# Patient Record
Sex: Male | Born: 1960 | Hispanic: Yes | Marital: Married
Health system: Southern US, Community
[De-identification: ages and names within clinical notes are randomized; demographics above are authoritative.]

## PROBLEM LIST (undated history)

## (undated) DIAGNOSIS — E119 Type 2 diabetes mellitus without complications: Secondary | ICD-10-CM

## (undated) HISTORY — PX: OTHER SURGICAL HISTORY: SHX169

---

## 2015-01-01 ENCOUNTER — Other Ambulatory Visit (HOSPITAL_COMMUNITY): Payer: Self-pay

## 2015-01-01 ENCOUNTER — Ambulatory Visit (HOSPITAL_COMMUNITY)
Admission: RE | Admit: 2015-01-01 | Discharge: 2015-01-01 | Disposition: A | Discharge status: Home / Self Care | Payer: Managed Care, Other (non HMO) | Source: Ambulatory Visit | Attending: Family Medicine | Admitting: Family Medicine

## 2015-01-01 DIAGNOSIS — M25562 Pain in left knee: Secondary | ICD-10-CM | POA: Diagnosis present

## 2015-01-01 DIAGNOSIS — M179 Osteoarthritis of knee, unspecified: Secondary | ICD-10-CM | POA: Diagnosis not present

## 2017-01-09 IMAGING — DX DG KNEE COMPLETE 4+V*L*
3 series · 3 of 3 positions shown · non-contrast
Comparison: None.

CLINICAL DATA: Left knee pain.

EXAM:
LEFT KNEE - COMPLETE 4+ VIEW

[knee ap]
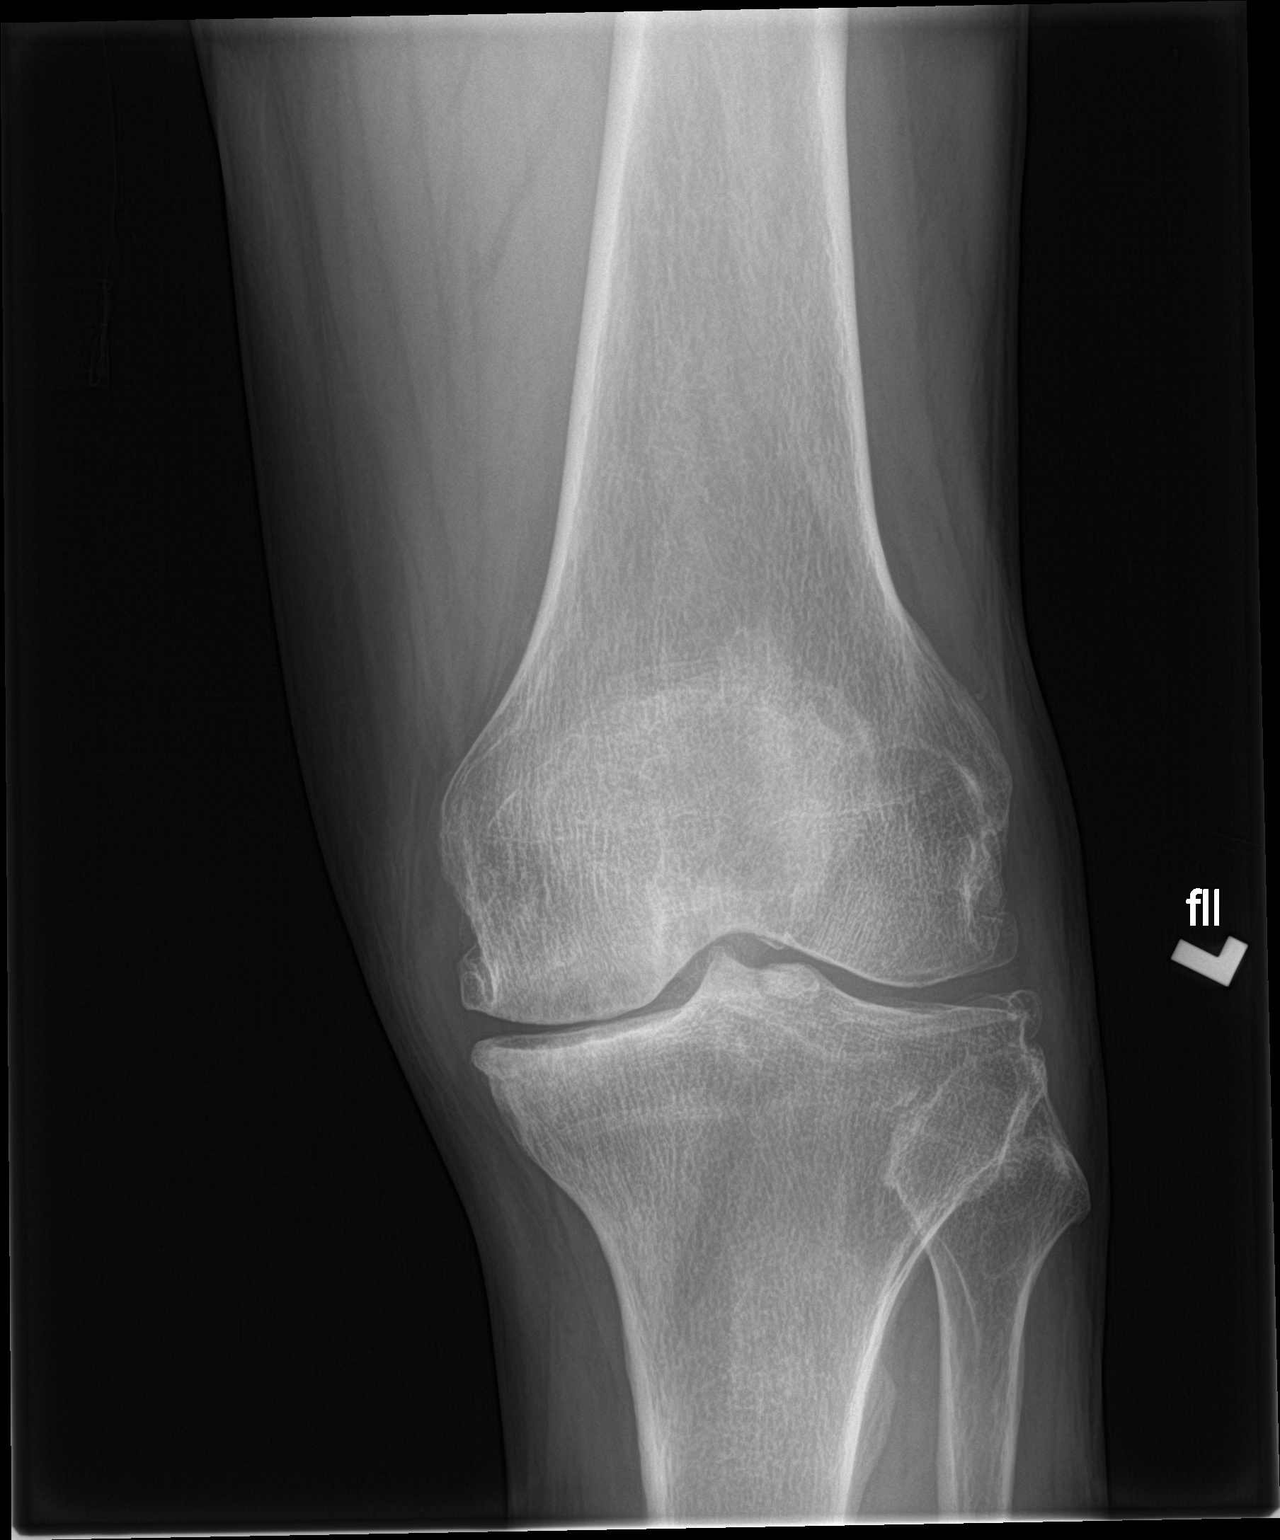

[knee lat]
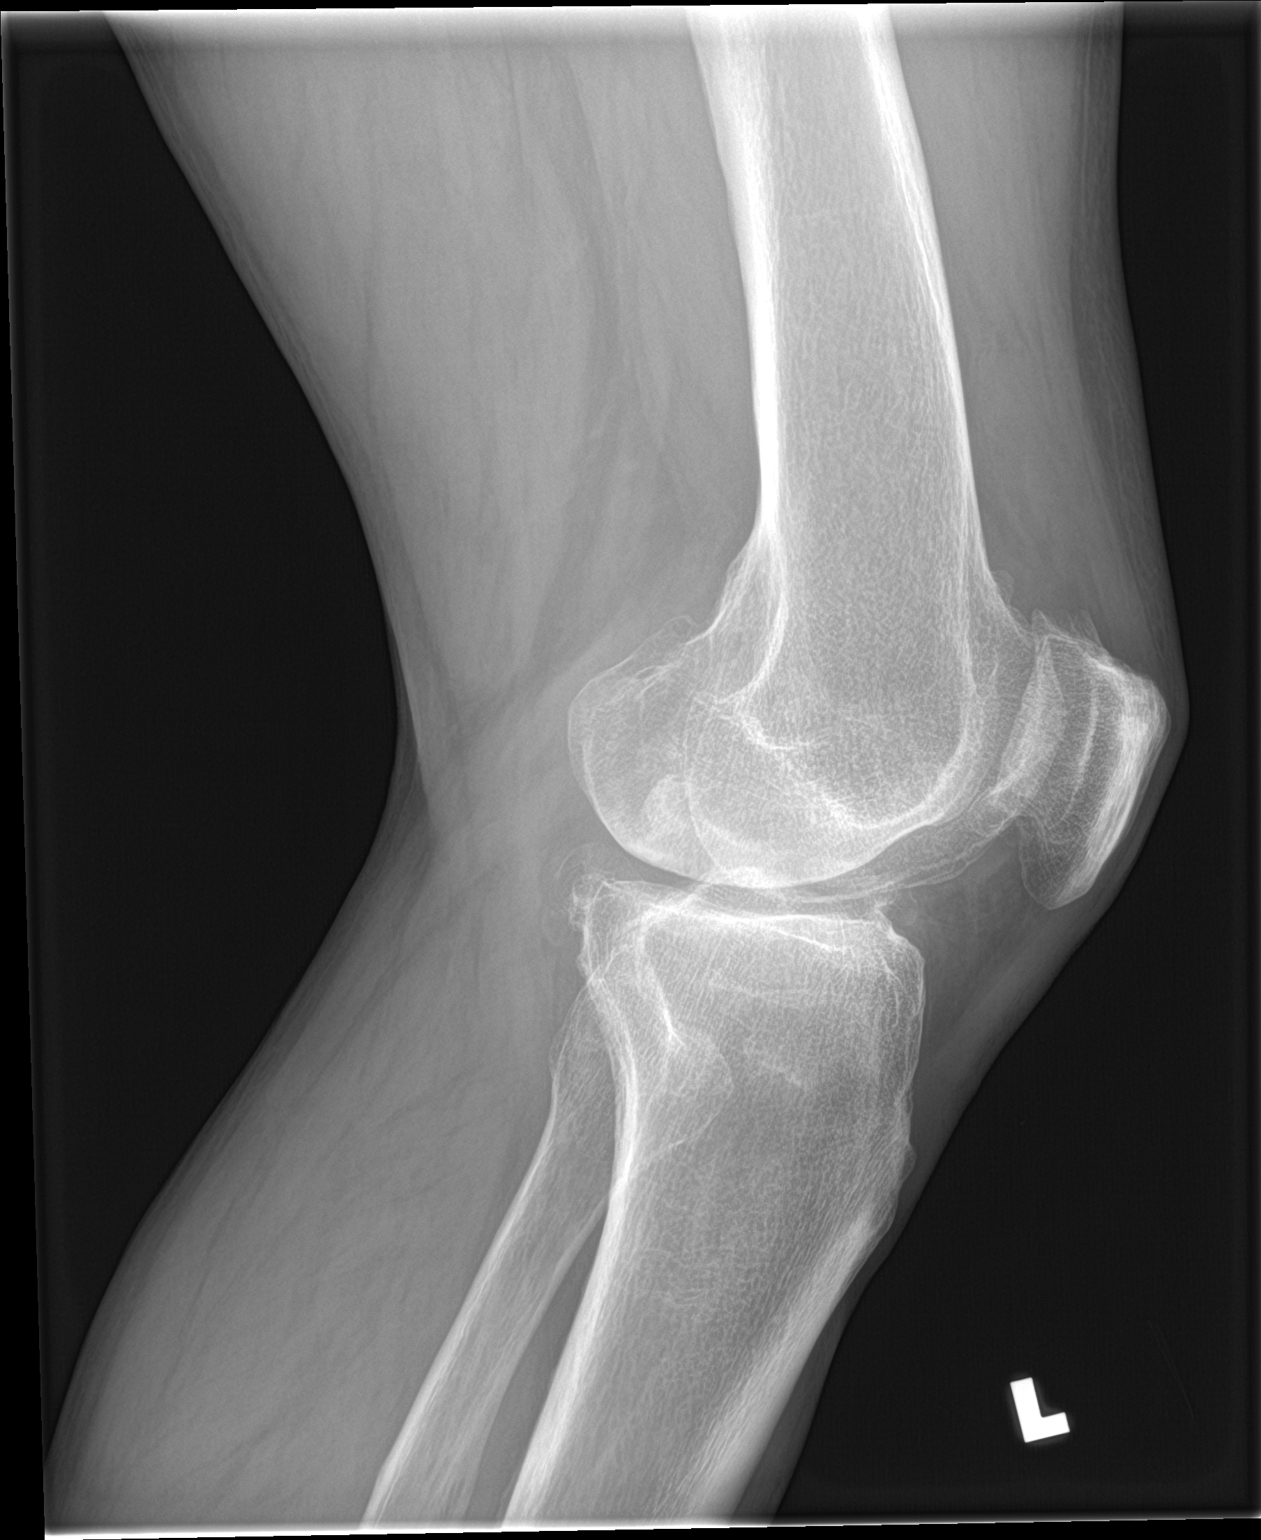

[knee sunrise]
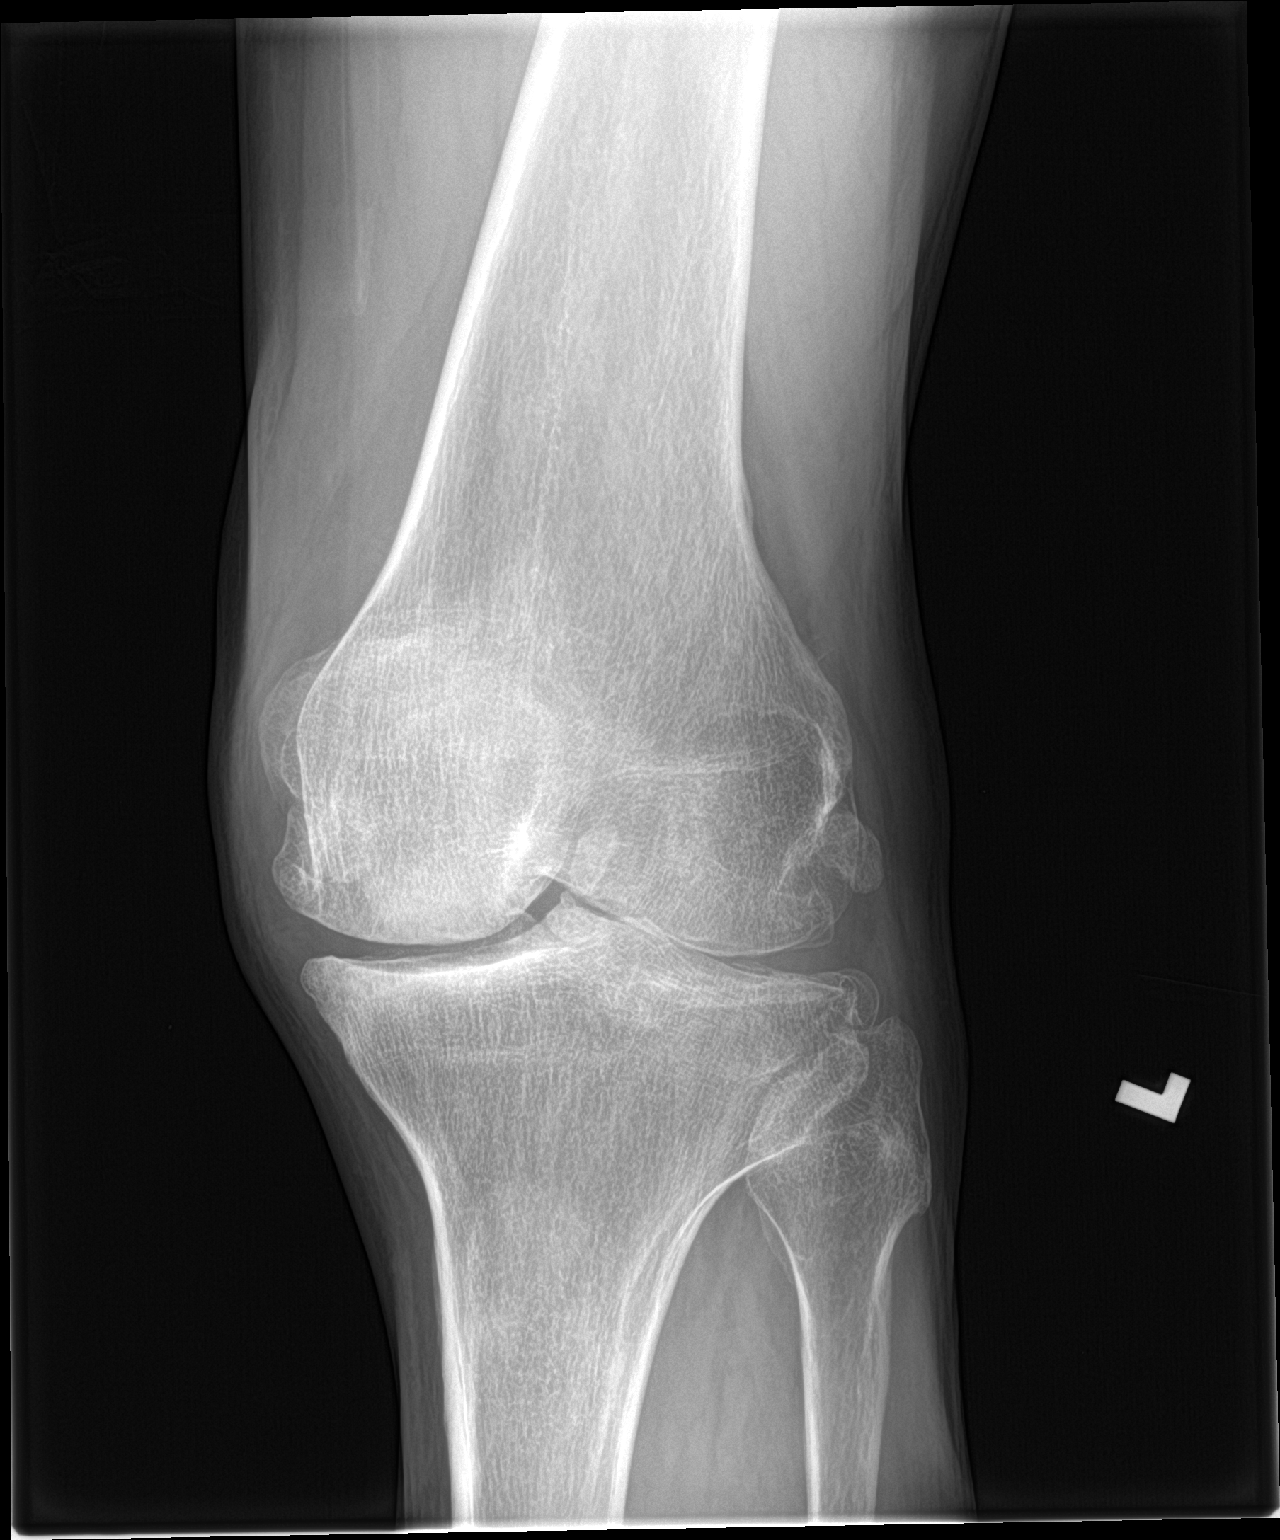

[3 of 3 positions shown; findings below may reference images not displayed]

FINDINGS: There is evidence of advanced tricompartmental osteoarthritis with
significant proliferative changes noted as well as joint space loss.
The most significant joint space narrowing involves the medial
compartment. No fracture, dislocation, joint effusion or bony lesion
is identified.
IMPRESSION: Advanced tricompartmental osteoarthritis of the left knee.

## 2017-04-21 ENCOUNTER — Other Ambulatory Visit: Payer: Self-pay

## 2017-04-21 ENCOUNTER — Encounter (HOSPITAL_COMMUNITY): Payer: Self-pay | Admitting: Emergency Medicine

## 2017-04-21 ENCOUNTER — Emergency Department (HOSPITAL_COMMUNITY)
Admission: EM | Admit: 2017-04-21 | Discharge: 2017-04-21 | Disposition: A | Discharge status: Home / Self Care | Payer: Managed Care, Other (non HMO) | Attending: Emergency Medicine | Admitting: Emergency Medicine

## 2017-04-21 DIAGNOSIS — E119 Type 2 diabetes mellitus without complications: Secondary | ICD-10-CM | POA: Insufficient documentation

## 2017-04-21 DIAGNOSIS — R197 Diarrhea, unspecified: Secondary | ICD-10-CM | POA: Insufficient documentation

## 2017-04-21 DIAGNOSIS — I471 Supraventricular tachycardia: Secondary | ICD-10-CM | POA: Insufficient documentation

## 2017-04-21 DIAGNOSIS — R5381 Other malaise: Secondary | ICD-10-CM | POA: Insufficient documentation

## 2017-04-21 DIAGNOSIS — Z7722 Contact with and (suspected) exposure to environmental tobacco smoke (acute) (chronic): Secondary | ICD-10-CM | POA: Insufficient documentation

## 2017-04-21 DIAGNOSIS — R110 Nausea: Secondary | ICD-10-CM | POA: Insufficient documentation

## 2017-04-21 HISTORY — DX: Type 2 diabetes mellitus without complications: E11.9

## 2017-04-21 LAB — CBC
HEMATOCRIT: 46.1 % (ref 39.0–52.0)
HEMOGLOBIN: 15 g/dL (ref 13.0–17.0)
MCH: 28.5 pg (ref 26.0–34.0)
MCHC: 32.5 g/dL (ref 30.0–36.0)
MCV: 87.5 fL (ref 78.0–100.0)
Platelets: 212 10*3/uL (ref 150–400)
RBC: 5.27 MIL/uL (ref 4.22–5.81)
RDW: 13.3 % (ref 11.5–15.5)
WBC: 8.6 10*3/uL (ref 4.0–10.5)

## 2017-04-21 LAB — BASIC METABOLIC PANEL
ANION GAP: 14 (ref 5–15)
BUN: 10 mg/dL (ref 6–20)
CHLORIDE: 103 mmol/L (ref 101–111)
CO2: 21 mmol/L — AB (ref 22–32)
Calcium: 8 mg/dL — ABNORMAL LOW (ref 8.9–10.3)
Creatinine, Ser: 0.84 mg/dL (ref 0.61–1.24)
GFR calc non Af Amer: >60 mL/min (ref >60)
GLUCOSE: 205 mg/dL — AB (ref 65–99)
POTASSIUM: 3.7 mmol/L (ref 3.5–5.1)
Sodium: 138 mmol/L (ref 135–145)

## 2017-04-21 LAB — TSH: TSH: 1.2 u[IU]/mL (ref 0.350–4.500)

## 2017-04-21 MED ORDER — ADENOSINE 6 MG/2ML IV SOLN
INTRAVENOUS | Status: AC
  Administered 2017-04-21: 6 mg
  Filled 2017-04-21: qty 6

## 2017-04-21 MED ORDER — SODIUM CHLORIDE 0.9 % IV BOLUS
500.0000 mL | Freq: Once | INTRAVENOUS | Status: AC
  Administered 2017-04-21: 500 mL via INTRAVENOUS

## 2017-04-21 NOTE — Discharge Instructions (Addendum)
It was our pleasure to provide your ER care today - we hope that you feel better.  Rest. Drink adequate fluids.  One of your lab tests (TSH level) will not be resulted until later - have your doctor follow up on that result tomorrow.  For rapid heart beat (SVT), follow up with cardiologist in the next 1-2 weeks - see referral - call office to arrange appointment.   Return to ER if worse, new symptoms, persistent fast heart beat, trouble breathing, fainting, persistent vomiting, other concern.

## 2017-04-21 NOTE — ED Notes (Signed)
Jeffery Mccarthy translator [phone] used for triage and adenosine administration.

## 2017-04-21 NOTE — ED Notes (Signed)
Adenosine 6MG  given at 1411.

## 2017-04-21 NOTE — ED Notes (Signed)
Pt given water at this time 

## 2017-04-21 NOTE — ED Notes (Signed)
Translator Lelon MastSamantha [phone]#[id number] used for d/c instructions, follow up care, and questions.

## 2017-04-21 NOTE — ED Notes (Signed)
Pt tolerating PO fluids at this time.

## 2017-04-21 NOTE — ED Triage Notes (Signed)
Pt seen at [facility]  Diarrhea, weakness x 4 days.  Pt was given 3 L fluids continued to have elevated HR. Sent to ED for evaluation.

## 2017-04-21 NOTE — ED Triage Notes (Signed)
Using translator, denies pain,  Complaining of diarrhea after eating.
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
## 2023-03-04 DIAGNOSIS — Z419 Encounter for procedure for purposes other than remedying health state, unspecified: Secondary | ICD-10-CM | POA: Diagnosis not present

## 2023-03-21 DIAGNOSIS — Z419 Encounter for procedure for purposes other than remedying health state, unspecified: Secondary | ICD-10-CM | POA: Diagnosis not present

## 2023-05-02 DIAGNOSIS — Z419 Encounter for procedure for purposes other than remedying health state, unspecified: Secondary | ICD-10-CM | POA: Diagnosis not present

## 2023-06-01 DIAGNOSIS — Z419 Encounter for procedure for purposes other than remedying health state, unspecified: Secondary | ICD-10-CM | POA: Diagnosis not present

## 2023-07-02 DIAGNOSIS — Z419 Encounter for procedure for purposes other than remedying health state, unspecified: Secondary | ICD-10-CM | POA: Diagnosis not present

## 2023-08-01 DIAGNOSIS — Z419 Encounter for procedure for purposes other than remedying health state, unspecified: Secondary | ICD-10-CM | POA: Diagnosis not present

## 2023-09-01 DIAGNOSIS — Z419 Encounter for procedure for purposes other than remedying health state, unspecified: Secondary | ICD-10-CM | POA: Diagnosis not present

## 2023-10-02 DIAGNOSIS — Z419 Encounter for procedure for purposes other than remedying health state, unspecified: Secondary | ICD-10-CM | POA: Diagnosis not present

## 2023-11-01 DIAGNOSIS — Z419 Encounter for procedure for purposes other than remedying health state, unspecified: Secondary | ICD-10-CM | POA: Diagnosis not present
